# Patient Record
Sex: Male | Born: 1970 | Race: Black or African American | Hispanic: No | Marital: Single | Smoking: Former smoker
Health system: Southern US, Community
[De-identification: ages and names within clinical notes are randomized; demographics above are authoritative.]

## PROBLEM LIST (undated history)

## (undated) DIAGNOSIS — I110 Hypertensive heart disease with heart failure: Secondary | ICD-10-CM

## (undated) DIAGNOSIS — I119 Hypertensive heart disease without heart failure: Secondary | ICD-10-CM

## (undated) DIAGNOSIS — Z8673 Personal history of transient ischemic attack (TIA), and cerebral infarction without residual deficits: Secondary | ICD-10-CM

## (undated) DIAGNOSIS — N183 Chronic kidney disease, stage 3 unspecified: Secondary | ICD-10-CM

## (undated) DIAGNOSIS — J449 Chronic obstructive pulmonary disease, unspecified: Secondary | ICD-10-CM

## (undated) DIAGNOSIS — G4730 Sleep apnea, unspecified: Secondary | ICD-10-CM

## (undated) DIAGNOSIS — I2699 Other pulmonary embolism without acute cor pulmonale: Secondary | ICD-10-CM

## (undated) DIAGNOSIS — I509 Heart failure, unspecified: Secondary | ICD-10-CM

## (undated) DIAGNOSIS — I43 Cardiomyopathy in diseases classified elsewhere: Secondary | ICD-10-CM

## (undated) DIAGNOSIS — M109 Gout, unspecified: Secondary | ICD-10-CM

## (undated) HISTORY — PX: UVULECTOMY: SHX2631

## (undated) HISTORY — PX: TONSILLECTOMY: SUR1361

## (undated) HISTORY — PX: ADENOIDECTOMY: SUR15

## (undated) HISTORY — DX: Heart failure, unspecified: I50.9

## (undated) HISTORY — DX: Sleep apnea, unspecified: G47.30

## (undated) HISTORY — DX: Cardiomyopathy in diseases classified elsewhere: I43

## (undated) HISTORY — DX: Hypertensive heart disease with heart failure: I11.0

## (undated) HISTORY — DX: Chronic kidney disease, stage 3 (moderate): N18.3

## (undated) HISTORY — DX: Gout, unspecified: M10.9

## (undated) HISTORY — DX: Other pulmonary embolism without acute cor pulmonale: I26.99

## (undated) HISTORY — DX: Personal history of transient ischemic attack (TIA), and cerebral infarction without residual deficits: Z86.73

## (undated) HISTORY — DX: Chronic kidney disease, stage 3 unspecified: N18.30

## (undated) HISTORY — DX: Hypertensive heart disease without heart failure: I11.9

## (undated) HISTORY — DX: Chronic obstructive pulmonary disease, unspecified: J44.9

---

## 2009-08-29 HISTORY — PX: CARDIAC CATHETERIZATION: SHX172

## 2012-04-15 ENCOUNTER — Other Ambulatory Visit: Payer: Self-pay | Admitting: *Deleted

## 2012-05-10 ENCOUNTER — Other Ambulatory Visit: Payer: Self-pay | Admitting: Emergency Medicine

## 2012-09-07 ENCOUNTER — Other Ambulatory Visit: Payer: Self-pay | Admitting: Emergency Medicine

## 2013-08-24 ENCOUNTER — Other Ambulatory Visit: Payer: Self-pay | Admitting: Emergency Medicine

## 2013-11-02 ENCOUNTER — Encounter: Payer: Self-pay | Admitting: Cardiovascular Disease

## 2013-11-02 ENCOUNTER — Ambulatory Visit (INDEPENDENT_AMBULATORY_CARE_PROVIDER_SITE_OTHER): Payer: Medicare Other | Admitting: Cardiovascular Disease

## 2013-11-02 ENCOUNTER — Encounter (INDEPENDENT_AMBULATORY_CARE_PROVIDER_SITE_OTHER): Payer: Self-pay

## 2013-11-02 VITALS — BP 100/82 | HR 115 | Ht 69.0 in | Wt 358.0 lb

## 2013-11-02 DIAGNOSIS — I428 Other cardiomyopathies: Secondary | ICD-10-CM

## 2013-11-02 DIAGNOSIS — G4733 Obstructive sleep apnea (adult) (pediatric): Secondary | ICD-10-CM | POA: Insufficient documentation

## 2013-11-02 DIAGNOSIS — I498 Other specified cardiac arrhythmias: Secondary | ICD-10-CM

## 2013-11-02 DIAGNOSIS — I420 Dilated cardiomyopathy: Secondary | ICD-10-CM | POA: Insufficient documentation

## 2013-11-02 DIAGNOSIS — R0602 Shortness of breath: Secondary | ICD-10-CM

## 2013-11-02 DIAGNOSIS — R0789 Other chest pain: Secondary | ICD-10-CM

## 2013-11-02 DIAGNOSIS — R000 Tachycardia, unspecified: Secondary | ICD-10-CM | POA: Insufficient documentation

## 2013-11-02 DIAGNOSIS — I509 Heart failure, unspecified: Secondary | ICD-10-CM

## 2013-11-02 DIAGNOSIS — I5032 Chronic diastolic (congestive) heart failure: Secondary | ICD-10-CM | POA: Insufficient documentation

## 2013-11-02 NOTE — Assessment & Plan Note (Signed)
Recommended he stay on Lasix 40 mg twice a day. Take extra Lasix as needed for worsening leg edema or shortness of breath or abdominal swelling

## 2013-11-02 NOTE — Assessment & Plan Note (Signed)
Initial EKG showing heart rate 115 beats per minute. We have recommended that he closely monitor his heart rate at home. He may need higher doses of carvedilol. Heart rate did improve on recheck with rest and oxygen

## 2013-11-02 NOTE — Assessment & Plan Note (Signed)
He reports that he wears CPAP nightly

## 2013-11-02 NOTE — Patient Instructions (Addendum)
You are doing well. Please cut the losartan in 1/2 daily If you continue to have dizzy spells,  Hold the losartan  Use extra coreg/carvedilol for fast heart rates Take extra lasix (80 mg up to twice a day) for leg swelling  Please call us if you have new issues that need to be addressed before your next appt.  Your physician wants you to follow-up in: 1 month.

## 2013-11-02 NOTE — Progress Notes (Signed)
Patient ID: Michael Archer, male    DOB: [birth year], 43 y.o.   MRN: [id number]  HPI Comments: Mr. Michael Archer is a 43 year old gentleman with morbid obesity, obstructive sleep apnea, chronic diastolic CHF, chronic renal insufficiency, history of DVT with hospital admission October 2014 for acute on chronic diastolic CHF who presents for new patient evaluation.  He reports that in general he has been doing well since his discharge from the hospital. Over the holidays, new years, he has been eating more and diet has been poor. Weight is up slightly. He has mildly worsening leg edema. He's taking Lasix 40 mg twice a day. He started to go to the gym recently. He's not doing any aerobic activity, only light weights and stretching.  Reports having a PE in July 2013 was started on anticoagulation which was stopped in early 2014. Possible bleeding complication and anticoagulation was held. He does report some dizziness when he stands up too quickly. Reports blood pressures typically very low at home. Heart rate elevated in the office, improved at rest. Also wears oxygen nasal cannula 2 L  EKG shows sinus tachycardia with rate 115 beats per minute, nonspecific ST abnormality, LVH  Echocardiogram 08/25/2013 showing ejection fraction 45-50%, moderately enlarged right atrium and ventricle, moderate to severe TR, moderate pulmonary hypertension   Outpatient Encounter Prescriptions as of 11/02/2013  Medication Sig  . carvedilol (COREG) 6.25 MG tablet Take 6.25 mg by mouth 2 (two) times daily with a meal.  . fluticasone (FLONASE) 50 MCG/ACT nasal spray Frequency:BID   Dosage:50   MCG  Instructions:  Note:Dose:  . furosemide (LASIX) 40 MG tablet Take 40 mg by mouth 2 (two) times daily.   . haloperidol (HALDOL) 1 MG tablet Take 1 mg by mouth every other day.   . losartan (COZAAR) 25 MG tablet Take 25 mg by mouth daily.   . potassium chloride (K-DUR) 10 MEQ tablet Take 1 tablet (10 mEq total) by mouth daily.    Review of Systems  Constitutional: Negative.   HENT: Negative.   Eyes: Negative.   Respiratory: Negative.   Cardiovascular: Negative.   Gastrointestinal: Negative.   Endocrine: Negative.   Musculoskeletal: Negative.   Skin: Negative.   Allergic/Immunologic: Negative.   Neurological: Negative.   Hematological: Negative.   Psychiatric/Behavioral: Negative.   All other systems reviewed and are negative.    BP 100/82  Pulse 115  Ht 5\' 9"  (1.753 m)  Wt 358 lb (162.388 kg)  BMI 52.84 kg/m2  Physical Exam  Nursing note and vitals reviewed. Constitutional: He is oriented to person, place, and time. He appears well-developed and well-nourished.  Mildly obese with nasal cannula oxygen  HENT:  Head: Normocephalic.  Nose: Nose normal.  Mouth/Throat: Oropharynx is clear and moist.  Eyes: Conjunctivae are normal. Pupils are equal, round, and reactive to light.  Neck: Normal range of motion. Neck supple. No JVD present.  Cardiovascular: Normal rate, regular rhythm, S1 normal, S2 normal, normal heart sounds and intact distal pulses.  Exam reveals no gallop and no friction rub.   No murmur heard. Pulmonary/Chest: Effort normal and breath sounds normal. No respiratory distress. He has no wheezes. He has no rales. He exhibits no tenderness.  Abdominal: Soft. Bowel sounds are normal. He exhibits no distension. There is no tenderness.  Musculoskeletal: Normal range of motion. He exhibits no edema and no tenderness.  Lymphadenopathy:    He has no cervical adenopathy.  Neurological: He is alert and oriented to person, place, and time. Coordination  normal.  Skin: Skin is warm and dry. No rash noted. No erythema.  Psychiatric: He has a normal mood and affect. His behavior is normal. Judgment and thought content normal.      Assessment and Plan

## 2013-11-02 NOTE — Assessment & Plan Note (Addendum)
Mildly depressed ejection fraction likely from underlying sleep apnea. We'll hold his losartan as he has hypotension and dizziness

## 2013-11-17 ENCOUNTER — Other Ambulatory Visit: Payer: Self-pay | Admitting: *Deleted

## 2013-11-17 MED ORDER — FUROSEMIDE 40 MG PO TABS: 40.0000 mg | ORAL_TABLET | Freq: Two times a day (BID) | ORAL | Status: DC

## 2013-11-17 NOTE — Telephone Encounter (Signed)
Does this patient continue this medication or did Dr. Mariah Milling want him to continue on furosimide 40mg ?

## 2013-11-24 ENCOUNTER — Telehealth: Payer: Self-pay

## 2013-11-24 NOTE — Telephone Encounter (Signed)
Pt questions if it is okay for him to take the herb Coleis Forskilii states it "thins the blood some and lower blood pressure." Please call and advise

## 2013-11-25 ENCOUNTER — Telehealth: Payer: Self-pay | Admitting: *Deleted

## 2013-11-25 MED ORDER — FUROSEMIDE 40 MG PO TABS: 40.0000 mg | ORAL_TABLET | Freq: Two times a day (BID) | ORAL | Status: DC

## 2013-11-25 NOTE — Telephone Encounter (Signed)
Spoke w/ pt.  He reports that he has been "messing up w/ my eating and drinking too much fluid".   Pt reports that over the past week, he has eating 3 whole bags of chips and had pasta each night. Pt has was advised to take lasix 1-2 pills daily for edema, but pt has been taking TID and only has 3 days worth of pills left. Pt states that he know what types of foods to eat, what to avoid, and that he needs to exercise, but he is waiting until Tuesday "to get my money" before he makes any changes. Advised pt that he needs to make lifestyle changes and not to rely on his pills.   Pt verbalizes understanding and states that he is plans to make changes next week. Pt asks that I refill his lasix and he will take them as prescribed and call if he feels any changes need to be made.

## 2013-11-25 NOTE — Telephone Encounter (Signed)
Patient called. He stated that he is holding fluid basically all over.

## 2013-11-25 NOTE — Telephone Encounter (Signed)
To be honest, I do not know much about the herb Uncertain it would cause bleeding complications If he has researched it and thinks it would benefit him, would start at low dose and work up slowly

## 2013-11-26 NOTE — Telephone Encounter (Signed)
Spoke w/ pt.  He verbalizes understanding and will let us know if he does start this supplement.

## 2013-12-08 ENCOUNTER — Ambulatory Visit (INDEPENDENT_AMBULATORY_CARE_PROVIDER_SITE_OTHER): Payer: Medicare Other | Admitting: Cardiovascular Disease

## 2013-12-08 ENCOUNTER — Encounter: Payer: Self-pay | Admitting: Cardiovascular Disease

## 2013-12-08 VITALS — BP 110/90 | HR 93 | Ht 69.0 in | Wt 362.5 lb

## 2013-12-08 DIAGNOSIS — I272 Pulmonary hypertension, unspecified: Secondary | ICD-10-CM

## 2013-12-08 DIAGNOSIS — R000 Tachycardia, unspecified: Secondary | ICD-10-CM

## 2013-12-08 DIAGNOSIS — I509 Heart failure, unspecified: Secondary | ICD-10-CM

## 2013-12-08 DIAGNOSIS — I498 Other specified cardiac arrhythmias: Secondary | ICD-10-CM

## 2013-12-08 DIAGNOSIS — I5032 Chronic diastolic (congestive) heart failure: Secondary | ICD-10-CM

## 2013-12-08 DIAGNOSIS — R0602 Shortness of breath: Secondary | ICD-10-CM

## 2013-12-08 DIAGNOSIS — I2789 Other specified pulmonary heart diseases: Secondary | ICD-10-CM

## 2013-12-08 MED ORDER — TORSEMIDE 20 MG PO TABS: 20.0000 mg | ORAL_TABLET | Freq: Three times a day (TID) | ORAL | Status: DC | PRN

## 2013-12-08 NOTE — Assessment & Plan Note (Signed)
Moderately elevated right ventricular systolic pressures on prior echocardiogram likely from obesity hypoventilation syndrome, diastolic dysfunction. We'll continue aggressive diuresis.

## 2013-12-08 NOTE — Assessment & Plan Note (Signed)
We have encouraged continued exercise, careful diet management in an effort to lose weight. 

## 2013-12-08 NOTE — Patient Instructions (Addendum)
You are doing well. Hold the lasix Start torsemide 20 mg twice a day Take an extra torsemide in the Am or PM for weight gain, shortness of breath   Please call us if you have new issues that need to be addressed before your next appt.  Your physician wants you to follow-up in: 6 months.  You will receive a reminder letter in the mail two months in advance. If you don't receive a letter, please call our office to schedule the follow-up appointment.

## 2013-12-08 NOTE — Assessment & Plan Note (Signed)
He reports having weight gain, fluid gain, swelling in his thighs which is his lower extremity edema equivalent. We will change him from Lasix 40 mg twice a day to torsemide 20 mg. twice a day with extra torsemide as needed for worsening leg swelling. He has followup with Dr. Cherylann Ratel in several weeks' time for lab work. He'll continue to take his potassium daily.

## 2013-12-08 NOTE — Progress Notes (Signed)
Patient ID: Michael Archer, male    DOB: 01/21/1971, 43 y.o.   MRN: [id number]  HPI Comments: Michael Archer is a 43 year old gentleman with morbid obesity, obstructive sleep apnea, chronic diastolic CHF, chronic renal insufficiency, history of DVT with hospital admission October 2014 for acute on chronic diastolic CHF who presents for routine followup Reports having a prior cardiac catheterization at [facility] per his report, showing no significant CAD  In followup today, he reports that his weight continues to trend upwards. He is up 4 pounds from his prior clinic visit. Blood pressure at home is typically well controlled. He has had a recent attack of gout in his ankle, knee, hip and has taken a few doses of ibuprofen for severe symptoms. He continues to wear oxygen 1-2 L. He reports that his Lasix is not working well and is interested in alternatives. He has started to go to the gym recently, limited by his gout. He's not doing any aerobic activity, only light weights and stretching.  Reports having a PE in July 2013 was started on anticoagulation which was stopped in early 2014. Possible bleeding complication and anticoagulation was held.  EKG shows sinus rhythm with rate 93 beats per minute, nonspecific ST abnormality in the anterior precordial leads  Echocardiogram 08/25/2013 showing ejection fraction 45-50%, moderately enlarged right atrium and ventricle, moderate to severe TR, moderate pulmonary hypertension   Outpatient Encounter Prescriptions as of 12/08/2013  Medication Sig  . carvedilol (COREG) 6.25 MG tablet Take 6.25 mg by mouth 2 (two) times daily with a meal.  . fluticasone (FLONASE) 50 MCG/ACT nasal spray Frequency:BID   Dosage:50   MCG  Instructions:  Note:Dose:  . furosemide (LASIX) 40 MG tablet Take 1 tablet (40 mg total) by mouth 2 (two) times daily.  . haloperidol (HALDOL) 1 MG tablet Take 1 mg by mouth every other day.   . losartan (COZAAR) 25 MG tablet Take 12.5 mg  one tablet as directed.  . potassium chloride (K-DUR) 10 MEQ tablet Take 1 tablet (10 mEq total) by mouth daily.    Review of Systems  Constitutional: Negative.   HENT: Negative.   Eyes: Negative.   Respiratory: Negative.   Cardiovascular: Negative.   Gastrointestinal: Negative.   Endocrine: Negative.   Musculoskeletal: Negative.   Skin: Negative.   Allergic/Immunologic: Negative.   Neurological: Negative.   Hematological: Negative.   Psychiatric/Behavioral: Negative.   All other systems reviewed and are negative.    BP 110/90  Pulse 93  Ht 5\' 9"  (1.753 m)  Wt 362 lb 8 oz (164.429 kg)  BMI 53.51 kg/m2  Physical Exam  Nursing note and vitals reviewed. Constitutional: He is oriented to person, place, and time. He appears well-developed and well-nourished.  Mildly obese with nasal cannula oxygen  HENT:  Head: Normocephalic.  Nose: Nose normal.  Mouth/Throat: Oropharynx is clear and moist.  Eyes: Conjunctivae are normal. Pupils are equal, round, and reactive to light.  Neck: Normal range of motion. Neck supple. No JVD present.  Cardiovascular: Normal rate, regular rhythm, S1 normal, S2 normal, normal heart sounds and intact distal pulses.  Exam reveals no gallop and no friction rub.   No murmur heard. Pulmonary/Chest: Effort normal and breath sounds normal. No respiratory distress. He has no wheezes. He has no rales. He exhibits no tenderness.  Abdominal: Soft. Bowel sounds are normal. He exhibits no distension. There is no tenderness.  Musculoskeletal: Normal range of motion. He exhibits no edema and no tenderness.  Lymphadenopathy:  He has no cervical adenopathy.  Neurological: He is alert and oriented to person, place, and time. Coordination normal.  Skin: Skin is warm and dry. No rash noted. No erythema.  Psychiatric: He has a normal mood and affect. His behavior is normal. Judgment and thought content normal.      Assessment and Plan

## 2013-12-08 NOTE — Assessment & Plan Note (Signed)
Heart rate improved on today's visit. We have recommended he continue on his carvedilol

## 2014-02-08 ENCOUNTER — Telehealth: Payer: Self-pay

## 2014-02-08 NOTE — Telephone Encounter (Signed)
Spoke w/ pt.  He reports that he is doing pretty good.  He has been going to the gym in the am, walking on the treadmill. He states that when he first gets on, his breathing is very hard and his whole body tightens up, but as he warms up, he feels better. Advised pt to start out slowly, but he reports that he is still only able to get up to 1.8 on the speed.  Pt states that he previously worked out continuously, missed about 6 years, but is glad to be back in the gym. He is hoping that he w/ continued effort, he can decrease his wt, as well as his meds.

## 2014-02-08 NOTE — Telephone Encounter (Signed)
Pt called to say he has "toned down" his Torsemide, he is taking 20-30 mg per day. Please call. Also states he still takes 2-3 Carvedilol per day.Also he has been  working out for the past 6 weeks 4-5 days per week.

## 2014-03-23 ENCOUNTER — Encounter: Payer: Self-pay | Admitting: Cardiovascular Disease

## 2014-03-23 ENCOUNTER — Ambulatory Visit (INDEPENDENT_AMBULATORY_CARE_PROVIDER_SITE_OTHER): Payer: Medicare Other | Admitting: Cardiovascular Disease

## 2014-03-23 VITALS — BP 118/90 | HR 98 | Ht 69.0 in | Wt 354.5 lb

## 2014-03-23 DIAGNOSIS — I2789 Other specified pulmonary heart diseases: Secondary | ICD-10-CM

## 2014-03-23 DIAGNOSIS — I498 Other specified cardiac arrhythmias: Secondary | ICD-10-CM

## 2014-03-23 DIAGNOSIS — I071 Rheumatic tricuspid insufficiency: Secondary | ICD-10-CM

## 2014-03-23 DIAGNOSIS — I509 Heart failure, unspecified: Secondary | ICD-10-CM

## 2014-03-23 DIAGNOSIS — I420 Dilated cardiomyopathy: Secondary | ICD-10-CM

## 2014-03-23 DIAGNOSIS — I272 Pulmonary hypertension, unspecified: Secondary | ICD-10-CM

## 2014-03-23 DIAGNOSIS — R000 Tachycardia, unspecified: Secondary | ICD-10-CM

## 2014-03-23 DIAGNOSIS — I079 Rheumatic tricuspid valve disease, unspecified: Secondary | ICD-10-CM

## 2014-03-23 DIAGNOSIS — G4733 Obstructive sleep apnea (adult) (pediatric): Secondary | ICD-10-CM

## 2014-03-23 DIAGNOSIS — R0602 Shortness of breath: Secondary | ICD-10-CM

## 2014-03-23 DIAGNOSIS — I5032 Chronic diastolic (congestive) heart failure: Secondary | ICD-10-CM

## 2014-03-23 DIAGNOSIS — I428 Other cardiomyopathies: Secondary | ICD-10-CM

## 2014-03-23 NOTE — Patient Instructions (Signed)
You are doing well. No medication changes were made.  We will schedule an echocardiogram for pulmonary HTN, tricuspid valve regurgitation, SOB, on oxygen  Please call us if you have new issues that need to be addressed before your next appt.  Your physician wants you to follow-up in: 6 months.  You will receive a reminder letter in the mail two months in advance. If you don't receive a letter, please call our office to schedule the follow-up appointment.

## 2014-03-23 NOTE — Assessment & Plan Note (Signed)
In October 2014 had moderate pulmonary hypertension. Still on oxygen with mild chronic shortness of breath. Echocardiogram pending

## 2014-03-23 NOTE — Assessment & Plan Note (Signed)
We have encouraged continued exercise, careful diet management in an effort to lose weight. 

## 2014-03-23 NOTE — Assessment & Plan Note (Signed)
Prior echocardiogram October 2014 documenting severe TR. Repeat echocardiogram has been ordered to evaluate his valve. Uncertain if he would be a candidate for angioplasty if still severe.

## 2014-03-23 NOTE — Assessment & Plan Note (Signed)
Echocardiogram has been ordered to evaluate his right heart pressures, Evaluate his tricuspid valve regurgitation Continues to have mild chronic shortness of breath, requiring oxygen. If right heart pressures continue to run high, would likely recommend a higher diuretic dosing in an effort to wean off the oxygen

## 2014-03-23 NOTE — Progress Notes (Signed)
Patient ID: Michael Archer, male    DOB: 01-20-1971, 43 y.o.   MRN: [id number]  HPI Comments: Mr. Michael Archer is a 43 year old gentleman with morbid obesity, obstructive sleep apnea, chronic diastolic CHF, chronic renal insufficiency, history of DVT with hospital admission October 2014 for acute on chronic diastolic CHF who presents for routine followup Reports having a prior cardiac catheterization at [facility] per his report, showing no significant CAD  In followup today, his weight is down 8 pounds. He's been taking less torsemide, in general 20-30 mg daily. He denies having any significant lower extremity edema. He continues to rely on nasal cannula oxygen. He reports that he is at the gym for the past 12 weeks, doing light weights, very gentle aerobic. He is been compliant with his CPAP for the past 5 years. Was having problems with his back in his feet but now is better after seeing podiatry. He is requesting an echocardiogram as he continues to have mild shortness of breath with exertion.  Reports that he is doing better on torsemide and he is on Lasix. He is taking extra Coreg one half dose when he goes to the gym  Reports having a PE in July 2013 was started on anticoagulation which was stopped in early 2014. Possible bleeding complication and anticoagulation was held.  EKG shows sinus rhythm with rate 98 beats per minute, nonspecific ST abnormality in the anterior precordial leads  Echocardiogram 08/25/2013 showing ejection fraction 45-50%, moderately enlarged right atrium and ventricle, moderate to severe TR, moderate pulmonary hypertension   Outpatient Encounter Prescriptions as of 03/23/2014  Medication Sig  . albuterol (PROAIR HFA) 108 (90 BASE) MCG/ACT inhaler Inhale into the lungs.  . carvedilol (COREG) 6.25 MG tablet Take 6.25 mg by mouth 2 (two) times daily with a meal.  . fluticasone (FLONASE) 50 MCG/ACT nasal spray Frequency:BID   Dosage:50   MCG  Instructions:  Note:Dose:   . Fluticasone Furoate-Vilanterol (BREO ELLIPTA) 100-25 MCG/INH AEPB Inhale into the lungs daily.  . haloperidol (HALDOL) 1 MG tablet Take 1 mg by mouth every other day.   . losartan (COZAAR) 25 MG tablet Take one tablet in the a.m. & 1/2 tablet in the pm.  . Potassium Gluconate 550 MG TABS Take 1 to 1/2 tablet daily.  . predniSONE (DELTASONE) 10 MG tablet As directed  . torsemide (DEMADEX) 20 MG tablet Take one and half tablet daily.    Review of Systems  Constitutional: Negative.   HENT: Negative.   Eyes: Negative.   Respiratory: Negative.   Cardiovascular: Negative.   Gastrointestinal: Negative.   Endocrine: Negative.   Musculoskeletal: Negative.   Skin: Negative.   Allergic/Immunologic: Negative.   Neurological: Negative.   Hematological: Negative.   Psychiatric/Behavioral: Negative.   All other systems reviewed and are negative.   BP 118/90  Pulse 98  Ht 5\' 9"  (1.753 m)  Wt 354 lb 8 oz (160.8 kg)  BMI 52.33 kg/m2  Physical Exam  Nursing note and vitals reviewed. Constitutional: He is oriented to person, place, and time. He appears well-developed and well-nourished.  Mildly obese with nasal cannula oxygen  HENT:  Head: Normocephalic.  Nose: Nose normal.  Mouth/Throat: Oropharynx is clear and moist.  Eyes: Conjunctivae are normal. Pupils are equal, round, and reactive to light.  Neck: Normal range of motion. Neck supple. No JVD present.  Cardiovascular: Normal rate, regular rhythm, S1 normal, S2 normal, normal heart sounds and intact distal pulses.  Exam reveals no gallop and no friction  rub.   No murmur heard. Pulmonary/Chest: Effort normal and breath sounds normal. No respiratory distress. He has no wheezes. He has no rales. He exhibits no tenderness.  Abdominal: Soft. Bowel sounds are normal. He exhibits no distension. There is no tenderness.  Musculoskeletal: Normal range of motion. He exhibits no edema and no tenderness.  Lymphadenopathy:    He has no  cervical adenopathy.  Neurological: He is alert and oriented to person, place, and time. Coordination normal.  Skin: Skin is warm and dry. No rash noted. No erythema.  Psychiatric: He has a normal mood and affect. His behavior is normal. Judgment and thought content normal.      Assessment and Plan

## 2014-03-23 NOTE — Assessment & Plan Note (Signed)
Recommended he continue to use extra doses of Coreg as needed, particularly when working out at the gym

## 2014-03-23 NOTE — Assessment & Plan Note (Signed)
He is tolerating his CPAP nightly for the past 5 years

## 2014-04-02 ENCOUNTER — Other Ambulatory Visit: Payer: Self-pay

## 2014-04-02 ENCOUNTER — Other Ambulatory Visit (INDEPENDENT_AMBULATORY_CARE_PROVIDER_SITE_OTHER): Payer: Medicare Other

## 2014-04-02 DIAGNOSIS — R0602 Shortness of breath: Secondary | ICD-10-CM

## 2014-04-02 DIAGNOSIS — I272 Pulmonary hypertension, unspecified: Secondary | ICD-10-CM

## 2014-04-02 DIAGNOSIS — R000 Tachycardia, unspecified: Secondary | ICD-10-CM

## 2014-04-02 DIAGNOSIS — I420 Dilated cardiomyopathy: Secondary | ICD-10-CM

## 2014-04-02 DIAGNOSIS — I509 Heart failure, unspecified: Secondary | ICD-10-CM

## 2014-04-02 DIAGNOSIS — I5032 Chronic diastolic (congestive) heart failure: Secondary | ICD-10-CM

## 2014-04-02 DIAGNOSIS — I369 Nonrheumatic tricuspid valve disorder, unspecified: Secondary | ICD-10-CM

## 2014-04-09 ENCOUNTER — Other Ambulatory Visit: Payer: Self-pay

## 2014-04-09 ENCOUNTER — Telehealth: Payer: Self-pay | Admitting: *Deleted

## 2014-04-09 DIAGNOSIS — R0600 Dyspnea, unspecified: Secondary | ICD-10-CM

## 2014-04-09 DIAGNOSIS — I272 Pulmonary hypertension, unspecified: Secondary | ICD-10-CM

## 2014-04-09 NOTE — Telephone Encounter (Signed)
Per Dr. Kirke Corin patient needs VQ scan  Patient verbalized understanding  Scheduling stated to have patient come now  Patient info and orders faxed to [facility]

## 2014-04-09 NOTE — Telephone Encounter (Signed)
Informed patient that his vq scan was normal  Patient verbalized understanding

## 2014-04-12 ENCOUNTER — Telehealth: Payer: Self-pay | Admitting: *Deleted

## 2014-04-12 NOTE — Telephone Encounter (Signed)
Patient called regarding other appointments?

## 2014-04-12 NOTE — Telephone Encounter (Signed)
Spoke w/ pt.  Reviewed results w/ pt.  He would like to come in to discuss w/ Dr. Mariah Milling.

## 2014-04-15 ENCOUNTER — Ambulatory Visit (INDEPENDENT_AMBULATORY_CARE_PROVIDER_SITE_OTHER): Payer: Medicare Other | Admitting: Cardiovascular Disease

## 2014-04-15 ENCOUNTER — Encounter: Payer: Self-pay | Admitting: Cardiovascular Disease

## 2014-04-15 ENCOUNTER — Telehealth: Payer: Self-pay

## 2014-04-15 VITALS — BP 108/84 | HR 87 | Ht 69.0 in | Wt 350.2 lb

## 2014-04-15 DIAGNOSIS — I509 Heart failure, unspecified: Secondary | ICD-10-CM

## 2014-04-15 DIAGNOSIS — G4733 Obstructive sleep apnea (adult) (pediatric): Secondary | ICD-10-CM

## 2014-04-15 DIAGNOSIS — I071 Rheumatic tricuspid insufficiency: Secondary | ICD-10-CM

## 2014-04-15 DIAGNOSIS — I079 Rheumatic tricuspid valve disease, unspecified: Secondary | ICD-10-CM

## 2014-04-15 DIAGNOSIS — I5032 Chronic diastolic (congestive) heart failure: Secondary | ICD-10-CM

## 2014-04-15 DIAGNOSIS — I2789 Other specified pulmonary heart diseases: Secondary | ICD-10-CM

## 2014-04-15 DIAGNOSIS — R079 Chest pain, unspecified: Secondary | ICD-10-CM

## 2014-04-15 DIAGNOSIS — I272 Pulmonary hypertension, unspecified: Secondary | ICD-10-CM

## 2014-04-15 NOTE — Telephone Encounter (Signed)
Left message for pt to call back regarding appt w/ Dr. Delton Coombes 05/07/14 @ 2:45.

## 2014-04-15 NOTE — Assessment & Plan Note (Signed)
Normal LV function on recent echocardiogram. Encouraged him to stay on his current dose of diuretic

## 2014-04-15 NOTE — Assessment & Plan Note (Signed)
We have encouraged continued exercise, careful diet management in an effort to lose weight. 

## 2014-04-15 NOTE — Addendum Note (Signed)
Addended by: Rhea Belton R on: 04/15/2014 02:06 PM   Modules accepted: Orders

## 2014-04-15 NOTE — Telephone Encounter (Signed)
Spoke w/ pt.  Provided him w/ address and phone number to Dr. Kavin Leech office.

## 2014-04-15 NOTE — Progress Notes (Signed)
Patient ID: Michael Archer, male    DOB: 08/27/1971, 43 y.o.   MRN: [id number]  HPI Comments: Michael Archer is a 43 year old gentleman with morbid obesity, obstructive sleep apnea, chronic diastolic CHF, chronic renal insufficiency, history of DVT in 2010, pulmonary embolism July 2013, with hospital admission October 2014 for acute on chronic diastolic CHF who presents for routine followup Reports having a prior cardiac catheterization at [facility] per his report, showing no significant CAD Recent creatinine 1.5  Echocardiogram in October 2014 showing ejection fraction 45-50%, severe pulmonary hypertension Repeat echocardiogram last week showing normal ejection fraction, dilated right ventricle, severe pulmonary hypertension with right ventricular systolic pressure estimated at 98 mmHg  On his visit today he reports that he feels well. He is taking torsemide twice a day, lower extremity leg edema has resolved. Typically he has swelling around his thighs though this has improved significantly on a diuretics. He is starting to do low-grade exercise. Continues to be on oxygen. Overall he is surprised by the results of the echocardiogram.  He is been compliant with his CPAP for the past 5 years.  Was having problems with his back in his feet but now is better after seeing podiatry.  Recent VQ scan 04/09/2014 showing no evidence of PE  Reports having a PE in July 2013 was started on anticoagulation which was stopped in early 2014. Possible bleeding complication and anticoagulation was held.  EKG shows sinus rhythm with rate 87 beats per minute, nonspecific ST abnormality in the anterior precordial leads, inferior leads  Outpatient Encounter Prescriptions as of 04/15/2014  Medication Sig  . albuterol (PROAIR HFA) 108 (90 BASE) MCG/ACT inhaler Inhale into the lungs.  . carvedilol (COREG) 6.25 MG tablet Take 6.25 mg by mouth 2 (two) times daily with a meal.  . fluticasone (FLONASE) 50 MCG/ACT nasal  spray Frequency:BID   Dosage:50   MCG  Instructions:  Note:Dose: 50MCG  . Fluticasone Furoate-Vilanterol (BREO ELLIPTA) 100-25 MCG/INH AEPB Inhale into the lungs daily.  . haloperidol (HALDOL) 1 MG tablet Take 1 mg by mouth every other day.   . losartan (COZAAR) 25 MG tablet Take one tablet in the a.m. & 1/2 tablet in the pm.  . Potassium Gluconate 550 MG TABS Take 1 to 1/2 tablet daily.  . predniSONE (DELTASONE) 10 MG tablet As directed  . torsemide (DEMADEX) 20 MG tablet Take one and half tablet daily.    Review of Systems  Constitutional: Negative.   HENT: Negative.   Eyes: Negative.   Respiratory: Positive for shortness of breath.   Cardiovascular: Negative.   Gastrointestinal: Negative.   Endocrine: Negative.   Musculoskeletal: Positive for back pain.       Foot pain  Skin: Negative.   Allergic/Immunologic: Negative.   Neurological: Negative.   Hematological: Negative.   Psychiatric/Behavioral: Negative.   All other systems reviewed and are negative.   BP 108/84  Pulse 87  Ht 5\' 9"  (1.753 m)  Wt 350 lb 4 oz (158.872 kg)  BMI 51.70 kg/m2  Physical Exam  Nursing note and vitals reviewed. Constitutional: He is oriented to person, place, and time. He appears well-developed and well-nourished.   nasal cannula oxygen, Morbidly obese  HENT:  Head: Normocephalic.  Nose: Nose normal.  Mouth/Throat: Oropharynx is clear and moist.  Eyes: Conjunctivae are normal. Pupils are equal, round, and reactive to light.  Neck: Normal range of motion. Neck supple. No JVD present.  Cardiovascular: Normal rate, regular rhythm, S1 normal, S2 normal, normal heart  sounds and intact distal pulses.  Exam reveals no gallop and no friction rub.   No murmur heard. Pulmonary/Chest: Effort normal and breath sounds normal. No respiratory distress. He has no wheezes. He has no rales. He exhibits no tenderness.  Abdominal: Soft. Bowel sounds are normal. He exhibits no distension. There is no tenderness.   Musculoskeletal: Normal range of motion. He exhibits no edema and no tenderness.  Lymphadenopathy:    He has no cervical adenopathy.  Neurological: He is alert and oriented to person, place, and time. Coordination normal.  Skin: Skin is warm and dry. No rash noted. No erythema.  Psychiatric: He has a normal mood and affect. His behavior is normal. Judgment and thought content normal.      Assessment and Plan

## 2014-04-15 NOTE — Assessment & Plan Note (Signed)
He is compliant with his CPAP for the past 5 years

## 2014-04-15 NOTE — Assessment & Plan Note (Signed)
Tricuspid regurgitation likely secondary to RV dilatation of high right-sided pressures

## 2014-04-15 NOTE — Patient Instructions (Signed)
You are doing well. No medication changes were made.  We will call you with an appt for pulmonary hypertension clinic  Please call us if you have new issues that need to be addressed before your next appt.  Your physician wants you to follow-up in: 6 months.  You will receive a reminder letter in the mail two months in advance. If you don't receive a letter, please call our office to schedule the follow-up appointment.

## 2014-04-15 NOTE — Assessment & Plan Note (Signed)
Severe pulmonary hypertension. He is reluctant to have right heart catheterization at this time. We have recommended we place a referral to the pulmonary hypertension clinic in [place]. He is amenable to this. Suspect he will need right heart catheterization with vasodilator challenge. Unable to exclude chronic thromboembolic disease as a cause of his presentation. Recent negative VQ scan. Despite this he does have a history of DVT and PEs. I've been reluctant to scan with CTA given underlying renal dysfunction.

## 2014-04-27 ENCOUNTER — Other Ambulatory Visit: Payer: Self-pay | Admitting: Emergency Medicine

## 2014-04-28 ENCOUNTER — Telehealth: Payer: Self-pay

## 2014-04-28 DIAGNOSIS — N289 Disorder of kidney and ureter, unspecified: Secondary | ICD-10-CM

## 2014-04-28 DIAGNOSIS — I2789 Other specified pulmonary heart diseases: Secondary | ICD-10-CM

## 2014-04-28 DIAGNOSIS — I9589 Other hypotension: Secondary | ICD-10-CM

## 2014-04-28 NOTE — Telephone Encounter (Signed)
Pt wanted to let you know he is in room #234.

## 2014-04-28 NOTE — Telephone Encounter (Signed)
Pt called and states that he is in [facility], he has not taken his BP medication since yesterday. Pt states that both his legs are swollen severely.He just wanted Dr. Mariah Milling to know.

## 2014-04-29 ENCOUNTER — Telehealth: Payer: Self-pay | Admitting: Cardiovascular Disease

## 2014-04-29 ENCOUNTER — Other Ambulatory Visit: Payer: Self-pay

## 2014-04-29 DIAGNOSIS — I270 Primary pulmonary hypertension: Secondary | ICD-10-CM

## 2014-04-29 NOTE — Telephone Encounter (Signed)
Made in mistake DISREGARD PHONE NOTE

## 2014-05-01 ENCOUNTER — Other Ambulatory Visit: Payer: Self-pay | Admitting: Emergency Medicine

## 2014-05-05 ENCOUNTER — Telehealth: Payer: Self-pay

## 2014-05-05 NOTE — Telephone Encounter (Signed)
Attempted to contact pt regarding discharge from [facility] on 05/04/14. Pt's number has been disconnected.

## 2014-05-06 NOTE — Telephone Encounter (Signed)
This encounter was created in error - please disregard.

## 2014-05-07 ENCOUNTER — Institutional Professional Consult (permissible substitution): Payer: Medicare Other | Admitting: Emergency Medicine

## 2014-05-07 NOTE — Telephone Encounter (Signed)
2nd attempt to contact pt regarding discharge from [facility] on 05/04/14. Pt's number still disconnected.

## 2014-05-10 ENCOUNTER — Other Ambulatory Visit: Payer: Self-pay | Admitting: Emergency Medicine

## 2014-05-10 DIAGNOSIS — I2789 Other specified pulmonary heart diseases: Secondary | ICD-10-CM

## 2014-05-10 DIAGNOSIS — I5032 Chronic diastolic (congestive) heart failure: Secondary | ICD-10-CM

## 2014-05-11 ENCOUNTER — Telehealth: Payer: Self-pay | Admitting: Physician Assistant

## 2014-05-11 NOTE — Telephone Encounter (Signed)
      I returned an outpatient phone call to Michael Archer concerning chest pain. I spoke with the patient who did not seem to be making much sense. He could not stay focused when I asked him why he called the cardiology outpatient phone call service. He stated that he was at [facility] being "held against his will and that he was having chest pains." He also said he needed help explaining to his father the difference between a CPAP and oxygen.?? He said the nurse has set up an appointment with Dr. Mariah Milling tomorrow; however, I do not see this appointment in [facility]. I advised him to report to the ED at [facility] if he does develop severe chest pain. He voiced understanding.   Thereasa Parkin PA-C  [facility]

## 2014-05-12 ENCOUNTER — Other Ambulatory Visit: Payer: Self-pay

## 2014-05-12 ENCOUNTER — Encounter: Payer: Medicare Other | Admitting: Cardiovascular Disease

## 2014-05-14 ENCOUNTER — Ambulatory Visit: Payer: Medicare Other | Admitting: Cardiovascular Disease

## 2014-05-29 DEATH — deceased

## 2015-10-07 IMAGING — CT CT CHEST W/O CM
2 of 3 series · 17 of 46 positions shown, 19 images · non-contrast
Comparison: Chest x-ray 04/27/2014.  Chest CT 09/07/2012.

CLINICAL DATA: Hypertension.

EXAM:
CT CHEST WITHOUT CONTRAST
TECHNIQUE: Multidetector CT imaging of the chest was performed following the
standard protocol without IV contrast..

[Series 2: routine chest wo · axial · 0.83mm/px · z∈[-530,-260]mm · 14 of 62 slices shown, 16 images]
[im 4/62  soft-tissue]
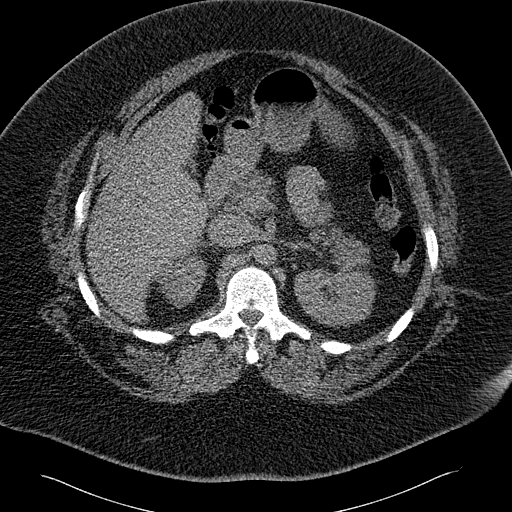
[im 4/62  bone]
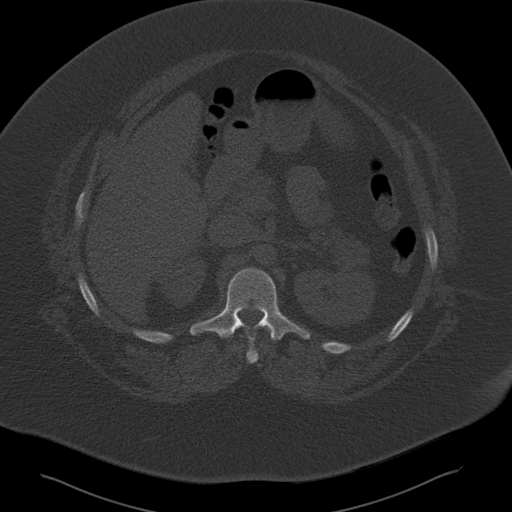
[im 8/62  soft-tissue]
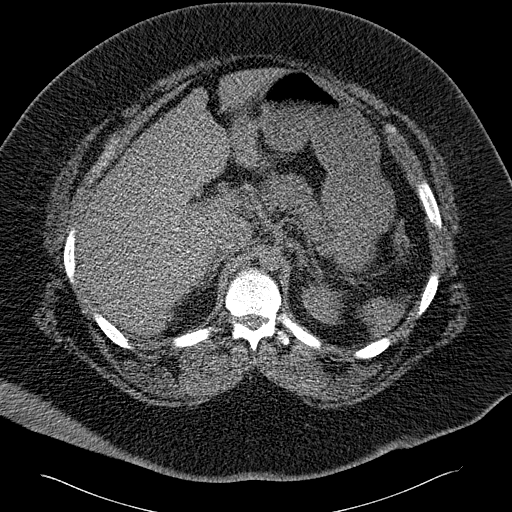
[im 12/62  soft-tissue]
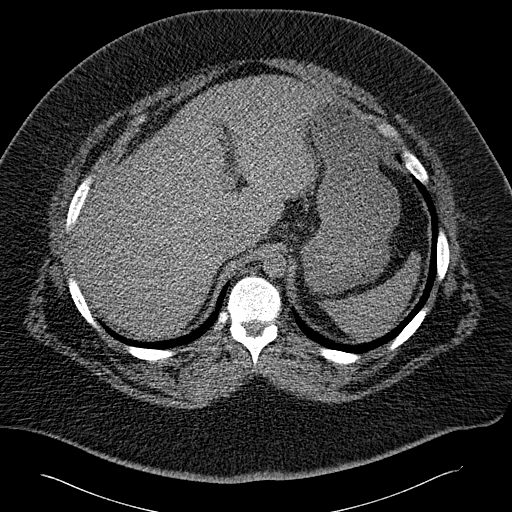
[im 16/62  soft-tissue]
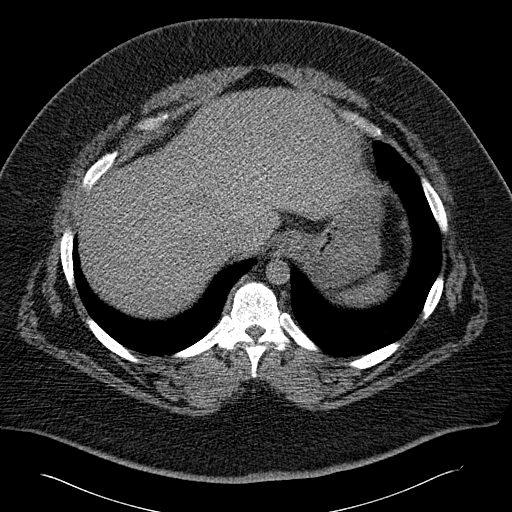
[im 20/62  soft-tissue]
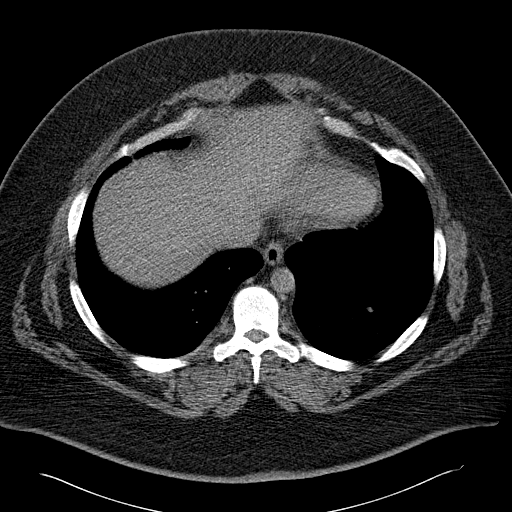
[im 24/62  soft-tissue]
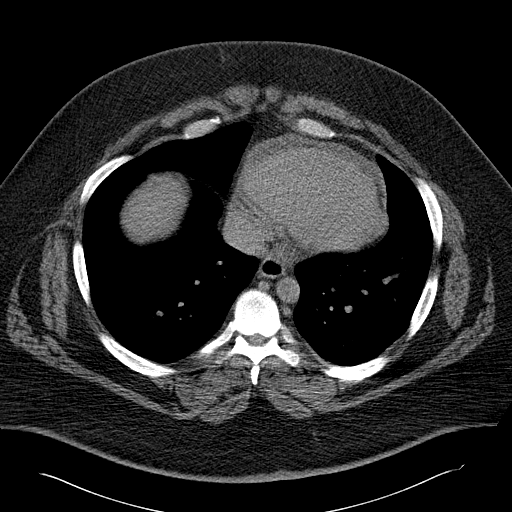
[im 28/62  soft-tissue]
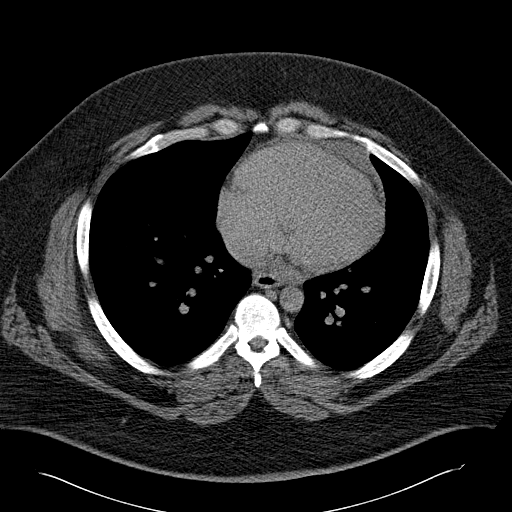
[im 34/62  soft-tissue]
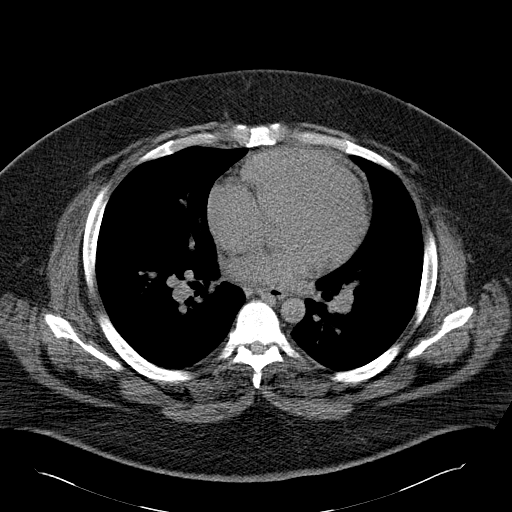
[im 38/62  soft-tissue]
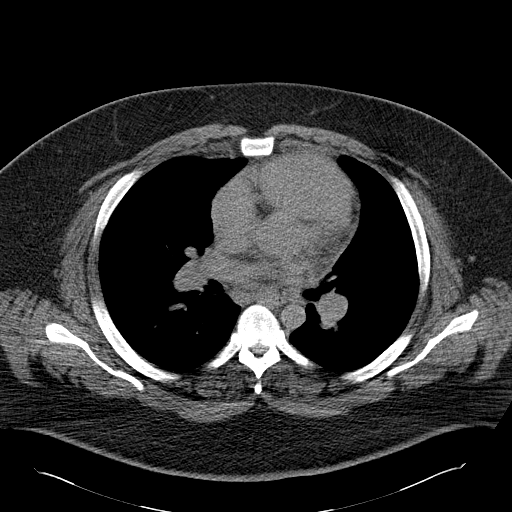
[im 38/62  bone]
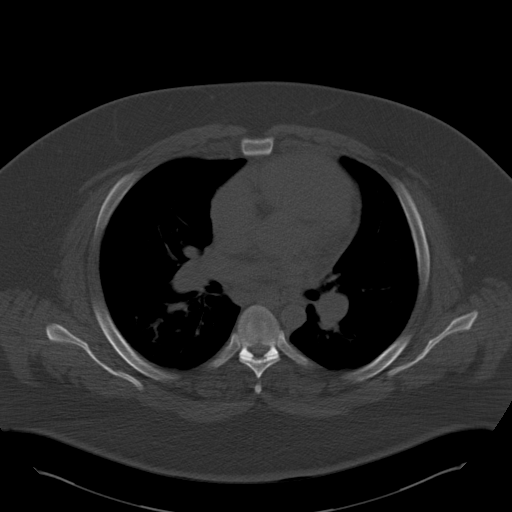
[im 42/62  soft-tissue]
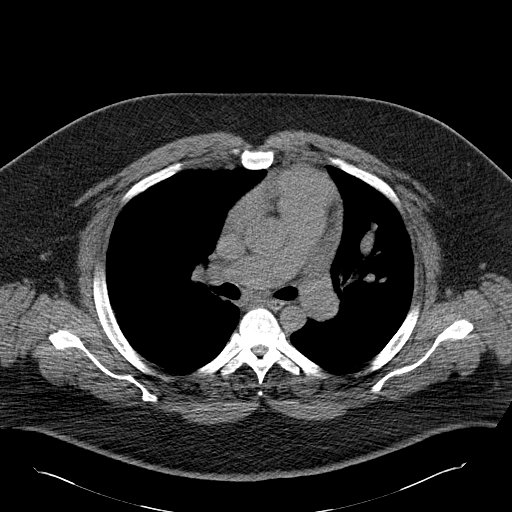
[im 46/62  soft-tissue]
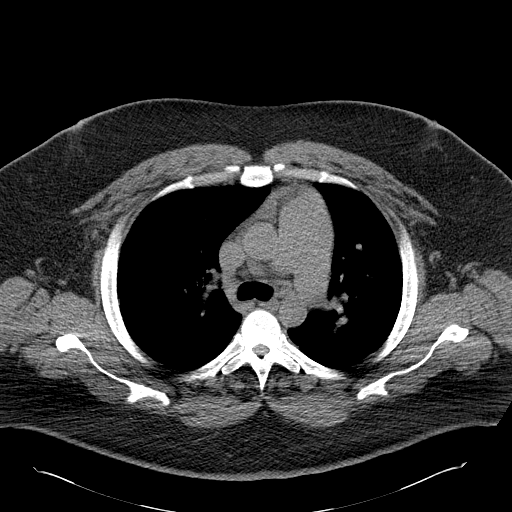
[im 50/62  soft-tissue]
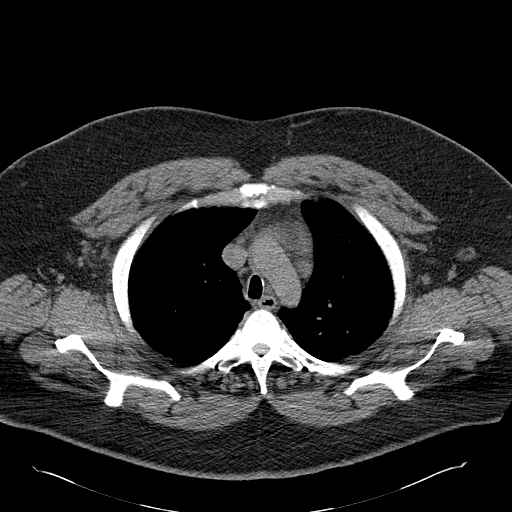
[im 54/62  soft-tissue]
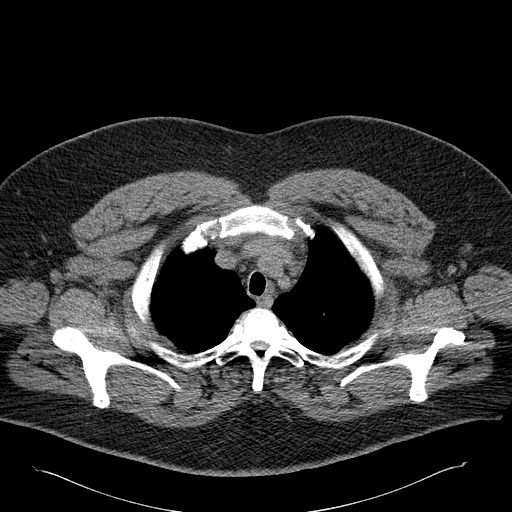
[im 58/62  soft-tissue]
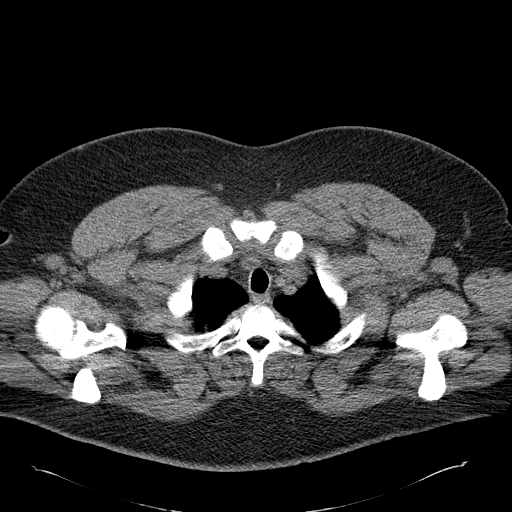

[Series 5: routine chest wo cor · coronal · 0.71mm/px · 3 of 151 slices shown]
[im 51/151  soft-tissue]
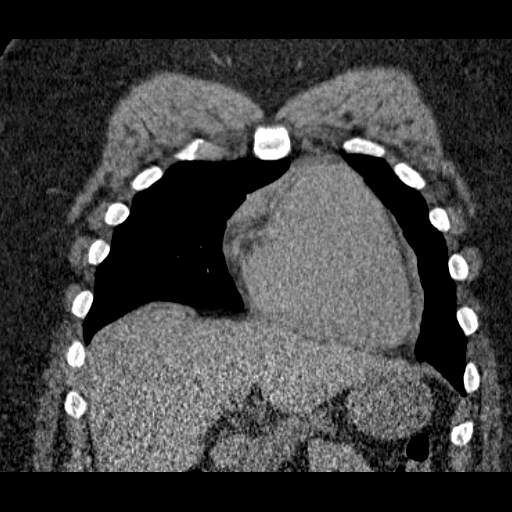
[im 67/151  soft-tissue]
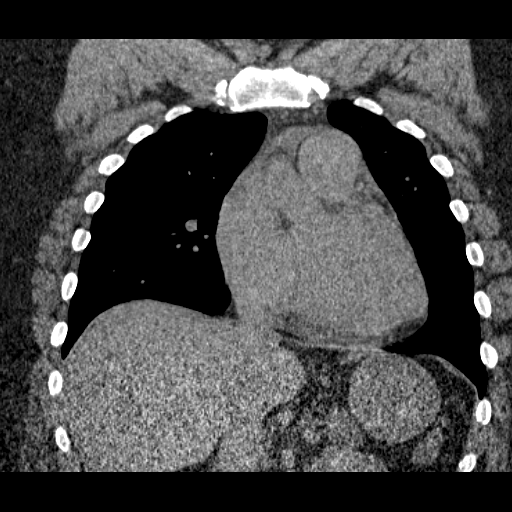
[im 84/151  soft-tissue]
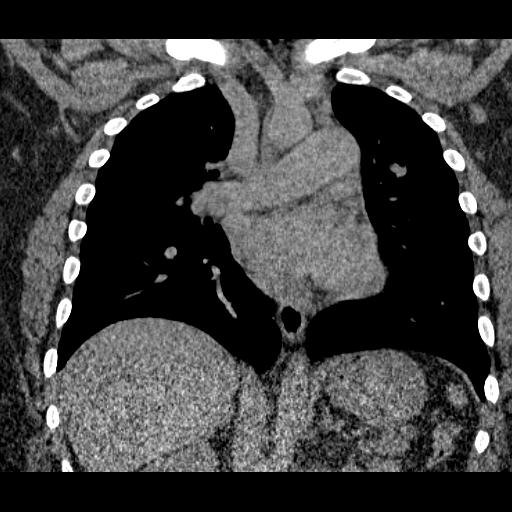

[17 of 46 positions shown; findings below may reference images not displayed]

FINDINGS: Thoracic aorta normal in caliber. Heart size normal. Tiny
pericardial effusion noted.

Multiple mediastinal lymph nodes are again noted. Largest measures
approximately 11 mm in diameter. These small lymph nodes are stable.
Thoracic esophagus unremarkable.

Patchy bilateral pulmonary infiltrates are present. These could be
related to bilateral pneumonitis and/or mild pulmonary edema.

Adrenals are normal. Visualized upper abdominal structures are
unremarkable.

No significant axillary adenopathy. No acute bony abnormality
identified.
IMPRESSION: 1. Patchy mild bilateral pulmonary infiltrates suggesting bilateral
pneumonitis and/or mild pulmonary edema.

2.  Small pericardial effusion.
